# Patient Record
Sex: Male | Born: 1995 | Race: White | Hispanic: No | Marital: Single | Smoking: Never smoker
Health system: Southern US, Community
[De-identification: ages and names within clinical notes are randomized; demographics above are authoritative.]

## PROBLEM LIST (undated history)

## (undated) DIAGNOSIS — J45909 Unspecified asthma, uncomplicated: Secondary | ICD-10-CM

## (undated) HISTORY — PX: TONSILLECTOMY: SUR1361

---
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
## 2016-12-30 IMAGING — CR DG CHEST 2V
2 series · 2 of 2 positions shown · non-contrast
Comparison: July 15, 2014

CLINICAL DATA: Rhonchi.  History of asthma.

EXAM:
CHEST  2 VIEW

[view not recorded (1 of 2)]
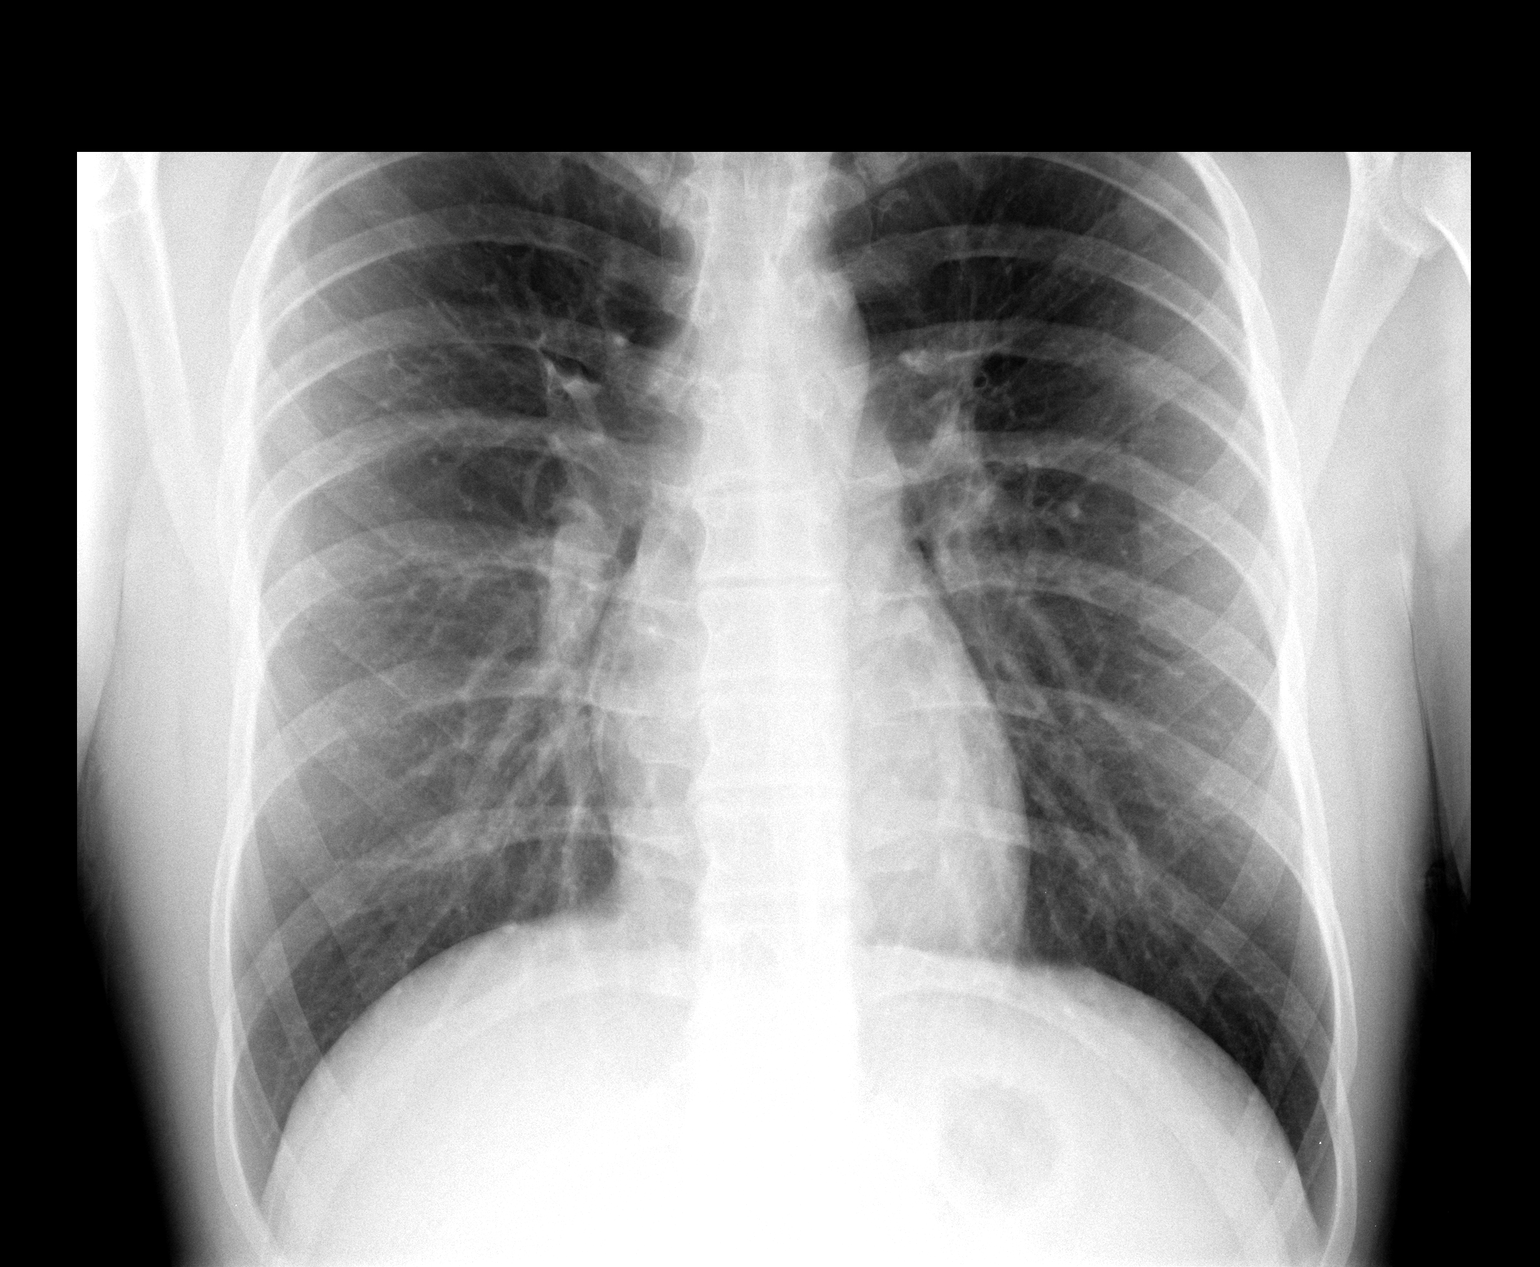

[view not recorded (2 of 2)]
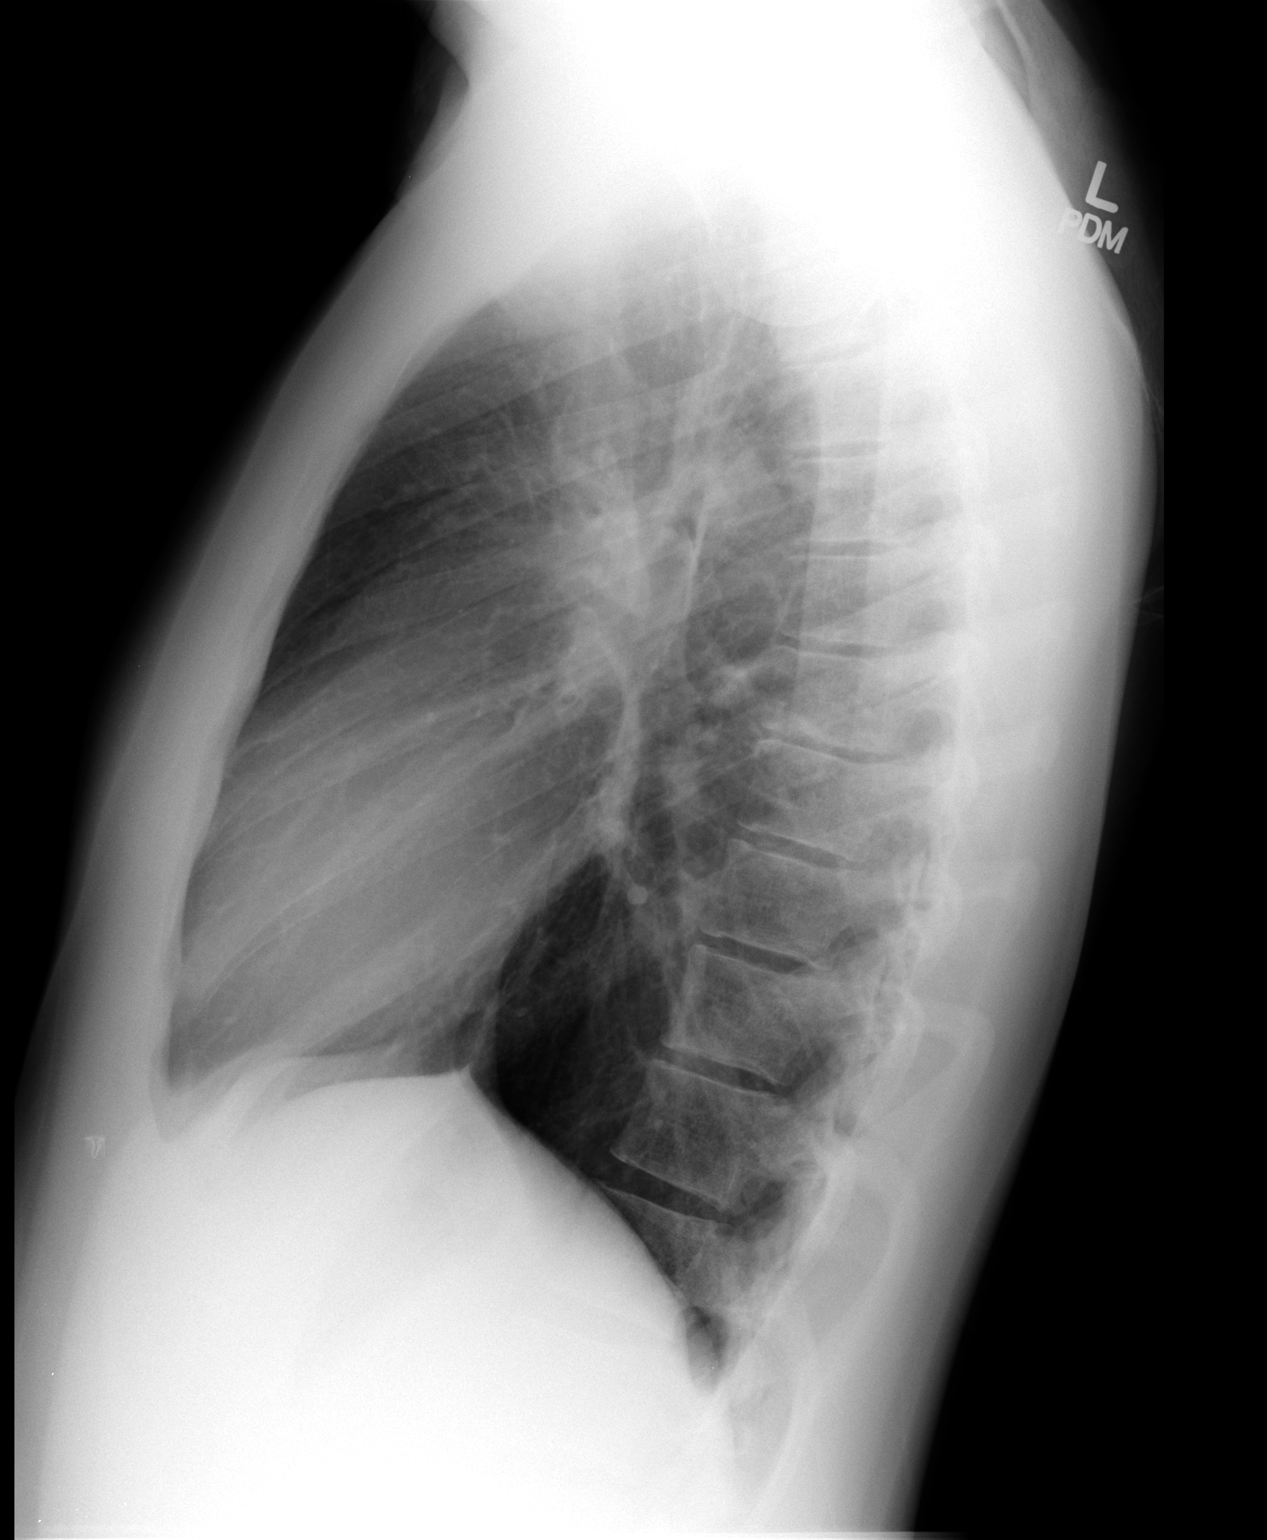

[2 of 2 positions shown; findings below may reference images not displayed]

FINDINGS: Lungs are clear. Heart size and pulmonary vascularity normal. No
adenopathy. No bone lesions.
IMPRESSION: No abnormality noted.
